# Patient Record
Sex: Female | Born: 1940 | Race: White | Hispanic: No | Smoking: Never smoker
Health system: Southern US, Community
[De-identification: ages and names within clinical notes are randomized; demographics above are authoritative.]

## PROBLEM LIST (undated history)

## (undated) DIAGNOSIS — K219 Gastro-esophageal reflux disease without esophagitis: Secondary | ICD-10-CM

## (undated) DIAGNOSIS — I10 Essential (primary) hypertension: Secondary | ICD-10-CM

## (undated) DIAGNOSIS — E079 Disorder of thyroid, unspecified: Secondary | ICD-10-CM

## (undated) HISTORY — PX: FOOT SURGERY: SHX648

## (undated) HISTORY — PX: CATARACT EXTRACTION W/ INTRAOCULAR LENS  IMPLANT, BILATERAL: SHX1307

## (undated) HISTORY — PX: ABDOMINAL HYSTERECTOMY: SHX81

## (undated) HISTORY — PX: KNEE SURGERY: SHX244

---

## 2018-07-15 ENCOUNTER — Other Ambulatory Visit: Payer: Self-pay

## 2018-07-15 ENCOUNTER — Emergency Department (HOSPITAL_COMMUNITY): Payer: Medicare Other

## 2018-07-15 ENCOUNTER — Emergency Department (HOSPITAL_COMMUNITY)
Admission: EM | Admit: 2018-07-15 | Discharge: 2018-07-16 | Disposition: A | Discharge status: Home / Self Care | Payer: Medicare Other | Attending: Emergency Medicine | Admitting: Emergency Medicine

## 2018-07-15 ENCOUNTER — Encounter (HOSPITAL_COMMUNITY): Payer: Self-pay | Admitting: Emergency Medicine

## 2018-07-15 DIAGNOSIS — R634 Abnormal weight loss: Secondary | ICD-10-CM | POA: Diagnosis not present

## 2018-07-15 DIAGNOSIS — R197 Diarrhea, unspecified: Secondary | ICD-10-CM | POA: Insufficient documentation

## 2018-07-15 DIAGNOSIS — I10 Essential (primary) hypertension: Secondary | ICD-10-CM | POA: Insufficient documentation

## 2018-07-15 DIAGNOSIS — R51 Headache: Secondary | ICD-10-CM | POA: Diagnosis not present

## 2018-07-15 DIAGNOSIS — Z79899 Other long term (current) drug therapy: Secondary | ICD-10-CM | POA: Insufficient documentation

## 2018-07-15 HISTORY — DX: Disorder of thyroid, unspecified: E07.9

## 2018-07-15 HISTORY — DX: Gastro-esophageal reflux disease without esophagitis: K21.9

## 2018-07-15 HISTORY — DX: Essential (primary) hypertension: I10

## 2018-07-15 LAB — CBC
HCT: 41.7 % (ref 36.0–46.0)
HEMOGLOBIN: 13.8 g/dL (ref 12.0–15.0)
MCH: 30.9 pg (ref 26.0–34.0)
MCHC: 33.1 g/dL (ref 30.0–36.0)
MCV: 93.3 fL (ref 80.0–100.0)
NRBC: 0 % (ref 0.0–0.2)
Platelets: 376 10*3/uL (ref 150–400)
RBC: 4.47 MIL/uL (ref 3.87–5.11)
RDW: 13.4 % (ref 11.5–15.5)
WBC: 9.1 10*3/uL (ref 4.0–10.5)

## 2018-07-15 LAB — COMPREHENSIVE METABOLIC PANEL
ALT: 21 U/L (ref 0–44)
AST: 26 U/L (ref 15–41)
Albumin: 4.3 g/dL (ref 3.5–5.0)
Alkaline Phosphatase: 48 U/L (ref 38–126)
Anion gap: 11 (ref 5–15)
BUN: 14 mg/dL (ref 8–23)
CO2: 24 mmol/L (ref 22–32)
Calcium: 9.4 mg/dL (ref 8.9–10.3)
Chloride: 100 mmol/L (ref 98–111)
Creatinine, Ser: 0.95 mg/dL (ref 0.44–1.00)
GFR calc non Af Amer: 59 mL/min — ABNORMAL LOW (ref >60)
Glucose, Bld: 115 mg/dL — ABNORMAL HIGH (ref 70–99)
Potassium: 3.8 mmol/L (ref 3.5–5.1)
Sodium: 135 mmol/L (ref 135–145)
Total Bilirubin: 1 mg/dL (ref 0.3–1.2)
Total Protein: 7 g/dL (ref 6.5–8.1)

## 2018-07-15 LAB — URINALYSIS, ROUTINE W REFLEX MICROSCOPIC
BILIRUBIN URINE: NEGATIVE
Glucose, UA: NEGATIVE mg/dL
Hgb urine dipstick: NEGATIVE
Ketones, ur: 5 mg/dL — AB
Nitrite: NEGATIVE
Protein, ur: NEGATIVE mg/dL
Specific Gravity, Urine: 1.018 (ref 1.005–1.030)
pH: 6 (ref 5.0–8.0)

## 2018-07-15 LAB — LIPASE, BLOOD: Lipase: 34 U/L (ref 11–51)

## 2018-07-15 MED ORDER — DIPHENHYDRAMINE HCL 50 MG/ML IJ SOLN
25.0000 mg | Freq: Once | INTRAMUSCULAR | Status: AC
  Administered 2018-07-15: 25 mg via INTRAVENOUS
  Filled 2018-07-15: qty 1

## 2018-07-15 MED ORDER — SODIUM CHLORIDE 0.9% FLUSH: 3.0000 mL | Freq: Once | INTRAVENOUS | Status: DC

## 2018-07-15 MED ORDER — HYDROCORTISONE NA SUCCINATE PF 100 MG IJ SOLR
200.0000 mg | Freq: Once | INTRAMUSCULAR | Status: AC
  Administered 2018-07-15: 200 mg via INTRAVENOUS
  Filled 2018-07-15: qty 4

## 2018-07-15 MED ORDER — SODIUM CHLORIDE 0.9 % IV BOLUS
1000.0000 mL | Freq: Once | INTRAVENOUS | Status: AC
  Administered 2018-07-15: 1000 mL via INTRAVENOUS

## 2018-07-15 NOTE — ED Triage Notes (Signed)
Pt. Has been treated with Flaggyl and is now taking Cipro, nothing is helping. Taken Lomotil and still having diarrhea.

## 2018-07-15 NOTE — ED Triage Notes (Signed)
Pt. Stated, Ive had diarrhea with a headache for 6 months. Ive also lost 25 lbs. In 6 months.

## 2018-07-15 NOTE — ED Provider Notes (Signed)
[facility] EMERGENCY DEPARTMENT Provider Note   CSN: [id number] Arrival date & time: 07/15/18  1904     History   Chief Complaint Chief Complaint  Patient presents with  . Diarrhea  . Headache    HPI Danara Bigley is a 78 y.o. female.  HPI Patient presents with diarrhea.  Has had for around 6 months now.  Has lost around 27 pounds.  Has been worked up by her PCP and has had reported stool studies that were negative including ova and parasites.  Patient may have had C. difficile tested but patient's family number states there were not able to see the results for that or that it was even tested out of the stool studies.  Reportedly has seen GI once and had an upper GI done.  Now has a dull headache.  Will get chills at time.  Rare cough.  States the more she eats and drinks the more diarrhea she we have.  She has been on Flagyl and then Cipro.  She is to continue diarrhea.  Has been on Lomotil.  Has had mild dull abdominal pain.  There was question that it may have started after eating some raw meat.  She is been having more urinary frequency also.  Also having some dull back pain.  Has had urinary and occasionally fecal incontinence.  Reportedly has known cysts in her back.  Diarrhea is watery.  Has had no blood.  She also will have dull headaches.  Reportedly TSH is been normal. Past Medical History:  Diagnosis Date  . GERD (gastroesophageal reflux disease)   . Hypertension   . Thyroid disease     There are no active problems to display for this patient.   History reviewed. No pertinent surgical history.   OB History   No obstetric history on file.      Home Medications    Prior to Admission medications   Medication Sig Start Date End Date Taking? Authorizing Provider  ALPRAZolam (XANAX) 0.25 MG tablet Take 0.25 mg by mouth 2 (two) times daily as needed. for anxiety 04/24/18  Yes [provider]  diphenoxylate-atropine (LOMOTIL) 2.5-0.025 MG  tablet Take 1 tablet by mouth every 6 (six) hours as needed for diarrhea or loose stools.  05/15/18  Yes [provider]  esomeprazole (NEXIUM) 40 MG capsule Take 40 mg by mouth daily. 06/01/18  Yes [provider]  levothyroxine (SYNTHROID, LEVOTHROID) 25 MCG tablet Take 25 mcg by mouth daily before breakfast.   Yes [provider]  lisinopril (PRINIVIL,ZESTRIL) 20 MG tablet Take 20 mg by mouth daily. 06/29/18  Yes [provider]    Family History No family history on file.  Social History Social History   Tobacco Use  . Smoking status: Never Smoker  . Smokeless tobacco: Never Used  Substance Use Topics  . Alcohol use: Not Currently  . Drug use: Not on file     Allergies   Sulfa antibiotics   Review of Systems Review of Systems  Constitutional: Positive for appetite change, chills, fatigue and unexpected weight change.  HENT: Negative for congestion.   Respiratory: Negative for shortness of breath.   Cardiovascular: Negative for chest pain.  Gastrointestinal: Positive for diarrhea. Negative for abdominal pain.  Genitourinary: Negative for flank pain.  Musculoskeletal: Positive for neck pain.  Skin: Negative for rash.  Neurological: Positive for weakness and headaches. Negative for tremors.  Hematological: Negative for adenopathy.  Psychiatric/Behavioral: Negative for behavioral problems.  Physical Exam Updated Vital Signs BP (!) 157/81   Pulse 86   Temp 98 F (36.7 C) (Oral)   Resp 14   Ht 5\' 6"  (1.676 m)   Wt 68 kg   SpO2 100%   BMI 24.21 kg/m   Physical Exam Constitutional:      Appearance: She is well-developed.  HENT:     Head: Atraumatic.  Neck:     Musculoskeletal: Neck supple.  Cardiovascular:     Rate and Rhythm: Normal rate and regular rhythm.  Pulmonary:     Effort: Pulmonary effort is normal.     Breath sounds: Normal breath sounds.  Abdominal:     Tenderness: There is no abdominal tenderness.    Musculoskeletal: Normal range of motion.  Neurological:     Mental Status: She is alert.  Psychiatric:        Mood and Affect: Mood normal.        Behavior: Behavior normal.      ED Treatments / Results  Labs (all labs ordered are listed, but only abnormal results are displayed) Labs Reviewed  COMPREHENSIVE METABOLIC PANEL - Abnormal; Notable for the following components:      Result Value   Glucose, Bld 115 (*)    GFR calc non Af Amer 59 (*)    All other components within normal limits  URINALYSIS, ROUTINE W REFLEX MICROSCOPIC - Abnormal; Notable for the following components:   Ketones, ur 5 (*)    Leukocytes, UA TRACE (*)    Bacteria, UA RARE (*)    All other components within normal limits  C DIFFICILE QUICK SCREEN W PCR REFLEX  GASTROINTESTINAL PANEL BY PCR, STOOL (REPLACES STOOL CULTURE)  LIPASE, BLOOD  CBC  SEROTONIN SERUM    EKG None  Radiology Dg Chest 2 View  Result Date: 07/15/2018 CLINICAL DATA:  Chills, diarrhea, hypertension EXAM: CHEST - 2 VIEW COMPARISON:  None. FINDINGS: The heart size and mediastinal contours are within normal limits. Both lungs are clear. The visualized skeletal structures are unremarkable. Electronic device over the left chest. IMPRESSION: No active cardiopulmonary disease. Electronically Signed   By: Jasmine Pang M.D.   On: 07/15/2018 22:14    Procedures Procedures (including critical care time)  Medications Ordered in ED Medications  sodium chloride flush (NS) 0.9 % injection 3 mL (has no administration in time range)  sodium chloride 0.9 % bolus 1,000 mL (1,000 mLs Intravenous New Bag/Given 07/15/18 2218)  hydrocortisone sodium succinate (SOLU-CORTEF) 100 MG injection 200 mg (has no administration in time range)  diphenhydrAMINE (BENADRYL) injection 25 mg (has no administration in time range)     Initial Impression / Assessment and Plan / ED Course  I have reviewed the triage vital signs and the nursing  notes.  Pertinent labs & imaging results that were available during my care of the patient were reviewed by me and considered in my medical decision making (see chart for details).     Patient with diarrhea.  He has had over the last 6 months.  Has had 27 pounds weight loss.  Labs reassuring.  Has had previous work-up.  However will get CAT scan.  Has had questionable dye allergy previously.  States she has had a CAT scan with contrast it sounds like she is just had Benadryl in the past and not had to get steroids although she did have one episode of shortness of breath prior.  Will give Benadryl and Solu-Cortef as an hour prep.  Discussed with radiology.  Care turned over to Dr. Preston FleetingGlick.  Final Clinical Impressions(s) / ED Diagnoses   Final diagnoses:  Diarrhea, unspecified type    ED Discharge Orders    None       Benjiman CorePickering, Harriette Tovey, MD 07/15/18 2305

## 2018-07-16 ENCOUNTER — Emergency Department (HOSPITAL_COMMUNITY): Payer: Medicare Other

## 2018-07-16 DIAGNOSIS — R197 Diarrhea, unspecified: Secondary | ICD-10-CM | POA: Diagnosis not present

## 2018-07-16 MED ORDER — IOHEXOL 300 MG/ML  SOLN
100.0000 mL | Freq: Once | INTRAMUSCULAR | Status: AC | PRN
  Administered 2018-07-16: 100 mL via INTRAVENOUS

## 2018-07-16 MED ORDER — CHOLESTYRAMINE 4 G PO PACK: 4.0000 g | PACK | Freq: Four times a day (QID) | ORAL | 0 refills | Status: DC

## 2018-07-16 NOTE — Discharge Instructions (Addendum)
You will need additional outpatient testing to find the cause of your diarrhea. Please make an appointment with the gastroenterologist.  You will be able to see the results of the blood serotonin level in My Chart.

## 2018-07-16 NOTE — ED Provider Notes (Signed)
Care assumed from Dr. Rubin PayorPickering, patient getting CT of abdomen and pelvis as part of evaluation for persistent diarrhea and weight loss.  CT shows mild diverticulosis, no explanation for weight loss.  Specifically, no evidence of tumor.  While this makes carcinoid less likely, there are carcinoid tumors can be present outside of the GI tract.  Serotonin level is pending.  Patient has been unable to give a stool sample in the ED.  She is referred to gastroenterology for further outpatient work-up.  We will give a therapeutic trial of cholestyramine.  Results for orders placed or performed during the hospital encounter of 07/15/18  Lipase, blood  Result Value Ref Range   Lipase 34 11 - 51 U/L  Comprehensive metabolic panel  Result Value Ref Range   Sodium 135 135 - 145 mmol/L   Potassium 3.8 3.5 - 5.1 mmol/L   Chloride 100 98 - 111 mmol/L   CO2 24 22 - 32 mmol/L   Glucose, Bld 115 (H) 70 - 99 mg/dL   BUN 14 8 - 23 mg/dL   Creatinine, Ser 1.610.95 0.44 - 1.00 mg/dL   Calcium 9.4 8.9 - 09.610.3 mg/dL   Total Protein 7.0 6.5 - 8.1 g/dL   Albumin 4.3 3.5 - 5.0 g/dL   AST 26 15 - 41 U/L   ALT 21 0 - 44 U/L   Alkaline Phosphatase 48 38 - 126 U/L   Total Bilirubin 1.0 0.3 - 1.2 mg/dL   GFR calc non Af Amer 59 (L) >60 mL/min   GFR calc Af Amer >60 >60 mL/min   Anion gap 11 5 - 15  CBC  Result Value Ref Range   WBC 9.1 4.0 - 10.5 K/uL   RBC 4.47 3.87 - 5.11 MIL/uL   Hemoglobin 13.8 12.0 - 15.0 g/dL   HCT 04.541.7 40.936.0 - 81.146.0 %   MCV 93.3 80.0 - 100.0 fL   MCH 30.9 26.0 - 34.0 pg   MCHC 33.1 30.0 - 36.0 g/dL   RDW 91.413.4 78.211.5 - 95.615.5 %   Platelets 376 150 - 400 K/uL   nRBC 0.0 0.0 - 0.2 %  Urinalysis, Routine w reflex microscopic  Result Value Ref Range   Color, Urine YELLOW YELLOW   APPearance CLEAR CLEAR   Specific Gravity, Urine 1.018 1.005 - 1.030   pH 6.0 5.0 - 8.0   Glucose, UA NEGATIVE NEGATIVE mg/dL   Hgb urine dipstick NEGATIVE NEGATIVE   Bilirubin Urine NEGATIVE NEGATIVE   Ketones, ur 5  (A) NEGATIVE mg/dL   Protein, ur NEGATIVE NEGATIVE mg/dL   Nitrite NEGATIVE NEGATIVE   Leukocytes, UA TRACE (A) NEGATIVE   RBC / HPF 0-5 0 - 5 RBC/hpf   WBC, UA 0-5 0 - 5 WBC/hpf   Bacteria, UA RARE (A) NONE SEEN   Squamous Epithelial / LPF 0-5 0 - 5   Mucus PRESENT    Dg Chest 2 View  Result Date: 07/15/2018 CLINICAL DATA:  Chills, diarrhea, hypertension EXAM: CHEST - 2 VIEW COMPARISON:  None. FINDINGS: The heart size and mediastinal contours are within normal limits. Both lungs are clear. The visualized skeletal structures are unremarkable. Electronic device over the left chest. IMPRESSION: No active cardiopulmonary disease. Electronically Signed   By: Jasmine PangKim  Fujinaga M.D.   On: 07/15/2018 22:14   Ct Abdomen Pelvis W Contrast  Result Date: 07/16/2018 CLINICAL DATA:  Diarrhea, weight loss EXAM: CT ABDOMEN AND PELVIS WITH CONTRAST TECHNIQUE: Multidetector CT imaging of the abdomen and pelvis was performed using the  standard protocol following bolus administration of intravenous contrast. CONTRAST:  OMNIPAQUE IOHEXOL 300 MG/ML  SOLN COMPARISON:  None. FINDINGS: Lower chest: No acute abnormality. Hepatobiliary: No focal liver abnormality is seen. No gallstones, gallbladder wall thickening, or biliary dilatation. Pancreas: Unremarkable. No pancreatic ductal dilatation or surrounding inflammatory changes. Spleen: Normal in size without focal abnormality. Adrenals/Urinary Tract: Adrenal glands are unremarkable. Kidneys are normal, without renal calculi, focal lesion, or hydronephrosis. Bladder is unremarkable. Stomach/Bowel: Stomach is within normal limits. Appendix not well seen but no right lower quadrant inflammatory process. No evidence of bowel wall thickening, distention, or inflammatory changes. Sigmoid colon diverticular disease without acute inflammatory process Vascular/Lymphatic: Moderate aortic atherosclerosis. No aneurysm. No significantly enlarged lymph nodes. Reproductive: Status post  hysterectomy. No adnexal masses. Other: Negative for free air or free fluid Musculoskeletal: No acute or significant osseous findings. IMPRESSION: 1. No CT evidence for acute intra-abdominal or pelvic abnormality. 2. Sigmoid colon diverticular disease without acute inflammatory process Electronically Signed   By: Jasmine Pang M.D.   On: 07/16/2018 01:00   Images viewed by me.    Dione Booze, MD 07/16/18 [phone]

## 2018-07-19 LAB — SEROTONIN SERUM: Serotonin, Serum: 152 ng/mL (ref 0–420)

## 2018-08-21 ENCOUNTER — Encounter: Payer: Self-pay | Admitting: Neurology

## 2018-09-02 NOTE — Progress Notes (Signed)
Melanie Graham was seen today in the movement disorders clinic for neurologic consultation at the request of Jonathon Bellows, DO.  The consultation is for the evaluation of tremor.  This patient is accompanied in the office by her friend who supplements the history.  Pt is referred for tremor but has multiple c/o today  Tremor: Yes.   , states that she had a "bad life" starting in July with diarrhea and fatigue and had multiple ER visits following this for "hyponatremia."  "I thought the tremor was coming from hyponatremia but now they think that its related to the hyperthyroidism."  States that she just saw endocrinology yesterday at [facility].    When is it noted the most?  Holding things, writing - states that it is much better now that she is no longer hyponatremic (brings in labs from December and Na was 125).  Unsure what made it better but does state that she went off a lot of medications because "I was having a bad life."  Fam hx of tremor?  No.  Affected by caffeine:  Doesn't drink any  Affected by alcohol:  Doesn't drink any  Affected by stress:  No.  Affected by fatigue:  No.  Spills soup if on spoon:  No.  Spills glass of liquid if full:  No.  Affects ADL's (tying shoes, brushing teeth, etc):  No.  Tremor inducing meds:  No. (only on OTC vitamins - no tremor inducing vitamins)  She also c/o burning paresthesias of armpit and deltoid bilaterally.  "My armpit has started to smell as well."   "I have always had a rusty neck."  When asked what that means, she c/o neck pain.  No neuroimaging previously of the neck.  Has had EMG of the legs in the past.  Unknown results.  None of the arms but states that she was previously told EMG of the legs would give her information about the burning paresthesias in the arms.     Neuroimaging of the brain has  previously been performed.  It is not available for my review today.  She had a carotid u/s on 05/27/18 and there was less than 50%  stenosis bilaterally.  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    She did not bring the films, only the reports.   ALLERGIES:   Allergies  Allergen Reactions   Sulfa Antibiotics Rash    CURRENT MEDICATIONS:  Outpatient Encounter Medications as of 09/04/2018  Medication Sig   [DISCONTINUED] ALPRAZolam (XANAX) 0.25 MG tablet Take 0.25 mg by mouth 2 (two) times daily as needed. for anxiety   [DISCONTINUED] cholestyramine (QUESTRAN) 4 g packet Take 1 packet (4 g total) by mouth 4 (four) times daily.   [DISCONTINUED] diphenoxylate-atropine (LOMOTIL) 2.5-0.025 MG tablet Take 1 tablet by mouth every 6 (six) hours as needed for diarrhea or loose stools.    [DISCONTINUED] esomeprazole (NEXIUM) 40 MG capsule Take 40 mg by mouth daily.   [DISCONTINUED] levothyroxine (SYNTHROID, LEVOTHROID) 25 MCG tablet Take 25 mcg by mouth daily before breakfast.   [DISCONTINUED] lisinopril (PRINIVIL,ZESTRIL) 20 MG tablet Take 20 mg by mouth daily.   No facility-administered encounter medications on file as of 09/04/2018.     PAST MEDICAL HISTORY:   Past Medical History:  Diagnosis Date   GERD (gastroesophageal reflux disease)    Hypertension    Thyroid disease     PAST SURGICAL HISTORY:   Past Surgical History:  Procedure Laterality  Date   ABDOMINAL HYSTERECTOMY     CATARACT EXTRACTION W/ INTRAOCULAR LENS  IMPLANT, BILATERAL Bilateral    FOOT SURGERY Right    KNEE SURGERY Right    meninscal tear    SOCIAL HISTORY:   Social History   Socioeconomic History   Marital status: Unknown    Spouse name: Not on file   Number of children: Not on file   Years of education: Not on file   Highest education level: Not on file  Occupational History   Occupation: retired    Comment: Location manager  Social Network engineer strain: Not on file   Food insecurity:    Worry: Not on file    Inability: Not on file   Transportation needs:     Medical: Not on file    Non-medical: Not on file  Tobacco Use   Smoking status: Never Smoker   Smokeless tobacco: Never Used  Substance and Sexual Activity   Alcohol use: Not Currently   Drug use: Not on file   Sexual activity: Not on file  Lifestyle   Physical activity:    Days per week: Not on file    Minutes per session: Not on file   Stress: Not on file  Relationships   Social connections:    Talks on phone: Not on file    Gets together: Not on file    Attends religious service: Not on file    Active member of club or organization: Not on file    Attends meetings of clubs or organizations: Not on file    Relationship status: Not on file   Intimate partner violence:    Fear of current or ex partner: Not on file    Emotionally abused: Not on file    Physically abused: Not on file    Forced sexual activity: Not on file  Other Topics Concern   Not on file  Social History Narrative   Not on file    FAMILY HISTORY:   Family Status  Relation Name Status   Mother  Deceased   Father  Deceased   Sister  Alive   Brother  Alive   Child  Deceased    ROS:  Review of Systems  Eyes: Negative.   Respiratory: Positive for shortness of breath (chronic).   Cardiovascular: Negative.   Gastrointestinal: Positive for diarrhea (recent neg colonocopy) and heartburn.  Genitourinary: Positive for frequency and urgency.  Musculoskeletal: Positive for back pain and neck pain.  Neurological: Positive for tingling.  Endo/Heme/Allergies: Negative.     PHYSICAL EXAMINATION:    VITALS:   Vitals:   09/04/18 1310  BP: 118/80  Pulse: 77  Temp: 98.8 F (37.1 C)  Weight: 144 lb (65.3 kg)  Height: 5\' 6"  (1.676 m)    GEN:  The patient appears stated age and is in NAD. HEENT:  Normocephalic, atraumatic.  The mucous membranes are moist. The superficial temporal arteries are without ropiness or tenderness. CV:  RRR Lungs:  CTAB Neck/HEME:  There are no carotid bruits  bilaterally.  Neurological examination:  Orientation: The patient is alert and oriented x3. Fund of knowledge is appropriate.  Recent and remote memory are intact.  Attention and concentration are normal.    Able to name objects and repeat phrases. Cranial nerves: There is good facial symmetry. Pupils are equal round and reactive to light bilaterally. Fundoscopic exam reveals clear margins bilaterally. Extraocular muscles are intact. The visual fields are full to confrontational testing.  The speech is fluent and clear. Soft palate rises symmetrically and there is no tongue deviation. Hearing is intact to conversational tone. Sensation: Sensation is intact to light and pinprick throughout (facial, trunk, extremities).  There were no asymmetries.  There was no sensory dermatomal level identified.  Vibration is intact at the bilateral big toe. There is no extinction with double simultaneous stimulation.  Motor: Strength is 5/5 in the bilateral upper and lower extremities.   Shoulder shrug is equal and symmetric.  There is no pronator drift. Deep tendon reflexes: Deep tendon reflexes are 2+/4 at the bilateral biceps, triceps, brachioradialis, patella and achilles. Plantar responses are downgoing bilaterally.  Movement examination: Tone: There is normal tone in the upper and lower extremities. Abnormal movements: There is no rest tremor.  There is very little postural tremor.  There is no intention tremor.  She has no trouble with Archimedes spirals.  She has no trouble with pouring water from one glass to another. Coordination:  There is no decremation with RAM's, with any form of RAMS, including alternating supination and pronation of the forearm, hand opening and closing, finger taps, heel taps and toe taps. Gait and Station: The patient has no difficulty arising out of a deep-seated chair without the use of the hands. The patient's stride length is normal.  She is able to ambulate in a tandem  fashion.  Labs: Lab work has been reviewed.  This was done on December 12, 2017.  White blood cells were 6.4, hemoglobin 13.6, hematocrit 41.0 and platelets 270.  Sodium was 139, potassium 4.7, chloride 101, CO2 30, BUN 19 and creatinine 0.9.  AST 28, ALT 30, TSH 1.62, B12 500.  She had lab work on March 06, 2018.  White blood cells were 7.0, hemoglobin 13.3, hematocrit 40.8 and platelets 283.  Sodium was 135, potassium 4.2, chloride 101, CO2 26, BUN 21, creatinine 1.0, TSH 1.38.  She had lab work in December, 2019.  At that point her sodium was low at 125, potassium 4.1, chloride 90, CO2 23, BUN 16 and creatinine 0.92.  Hemoglobin A1c was 6.1.  B12 was 3166.  She had lab work on August 14, 2018.  Sodium was 137, potassium 4.1, chloride 97, CO2 23, BUN 11, creatinine 0.94, glucose 113.  ASSESSMENT/PLAN:  1.  Tremor  -I really did not see any significant tremor today.  She may have a mild enhanced physiologic tremor, but otherwise did not see any significant degree of tremor.  She reports that it has gotten much better.  I would not recommend any treatment for the degree of tremor that I saw today.  Reassurance was provided.  2.  Paresthesias  -Her biggest complaint today was burning paresthesias of the arm, left greater than right.  I will do an MRI of the cervical spine just to make sure we are not missing anything like syringomyelia given that she describes a cape-like distribution of symptoms.  -She will have an EMG.  If this is negative, I told her that I really do not know the cause of her symptoms.    -Patient has a very supratherapeutic B12, and I told her she probably could stop her supplement.  She states that she has already done this.  3.  We will call her with the results of the above.  Cc:  Jonathon Bellows, DO

## 2018-09-04 ENCOUNTER — Ambulatory Visit (INDEPENDENT_AMBULATORY_CARE_PROVIDER_SITE_OTHER): Payer: Medicare Other | Admitting: Neurology

## 2018-09-04 ENCOUNTER — Other Ambulatory Visit: Payer: Self-pay

## 2018-09-04 ENCOUNTER — Encounter: Payer: Self-pay | Admitting: Neurology

## 2018-09-04 VITALS — BP 118/80 | HR 77 | Temp 98.8°F | Ht 66.0 in | Wt 144.0 lb

## 2018-09-04 DIAGNOSIS — R251 Tremor, unspecified: Secondary | ICD-10-CM | POA: Diagnosis not present

## 2018-09-04 DIAGNOSIS — M542 Cervicalgia: Secondary | ICD-10-CM | POA: Diagnosis not present

## 2018-09-04 DIAGNOSIS — R202 Paresthesia of skin: Secondary | ICD-10-CM | POA: Diagnosis not present

## 2018-09-04 NOTE — Patient Instructions (Signed)
You will receive a phone call to schedule an appointment at [facility] for an open MRI  You will be scheduled for an EMG

## 2018-09-14 ENCOUNTER — Telehealth: Payer: Self-pay | Admitting: Neurology

## 2018-09-14 NOTE — Telephone Encounter (Signed)
[facility] is needing valid orders for the MRI with or without contrast for cervical Spine. Please fax this over to [phone]. Thanks!

## 2018-09-14 NOTE — Telephone Encounter (Signed)
Dr. Arbutus Leas ordered MR C-spine without contrast. Order sent to [phone] with confirmation received.

## 2018-09-14 NOTE — Telephone Encounter (Signed)
Dr. Tat patient 

## 2018-09-29 ENCOUNTER — Encounter: Payer: Medicare Other | Admitting: Neurology

## 2018-11-05 ENCOUNTER — Telehealth: Payer: Self-pay | Admitting: Neurology

## 2018-11-05 NOTE — Telephone Encounter (Signed)
Patient was wanting to know about MRI that was supposed to be ordered for her neck and head. She is wanting to get this done in [place] and is needing an order to give them. In [facility] in [place]. Please call her back at [phone]. Thanks!

## 2018-11-05 NOTE — Telephone Encounter (Signed)
Patient notified that I will send the order to [facility] in [place].

## 2018-11-12 ENCOUNTER — Telehealth: Payer: Self-pay | Admitting: *Deleted

## 2018-11-12 NOTE — Telephone Encounter (Signed)
I called patient to make sure that [facility] got her referral.  She said they did and she will be having MRI soon.

## 2019-03-14 IMAGING — DX DG CHEST 2V
2 series · 2 of 2 positions shown · non-contrast
Comparison: None.

CLINICAL DATA: Chills, diarrhea, hypertension

EXAM:
CHEST - 2 VIEW

[chest pa]
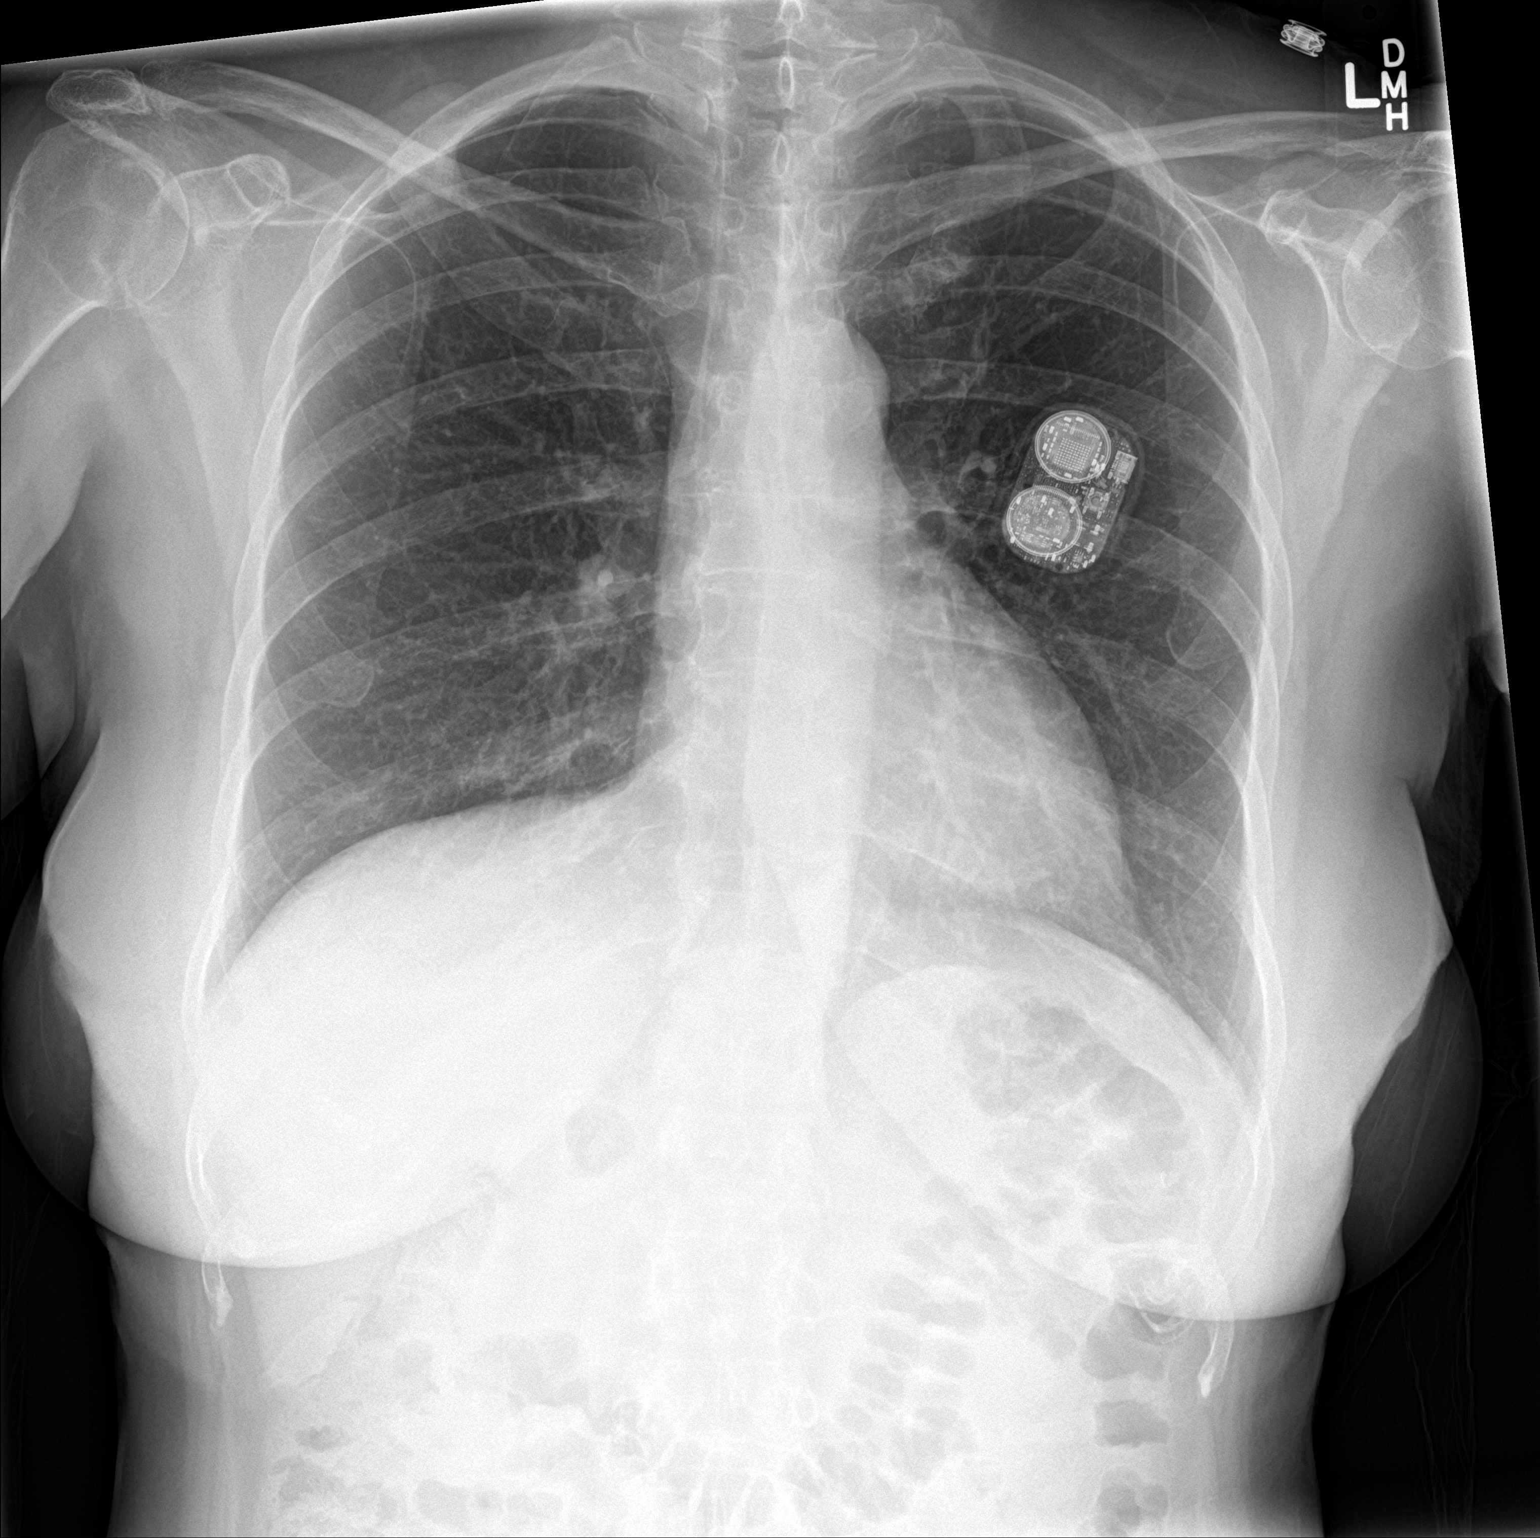

[chest lat]
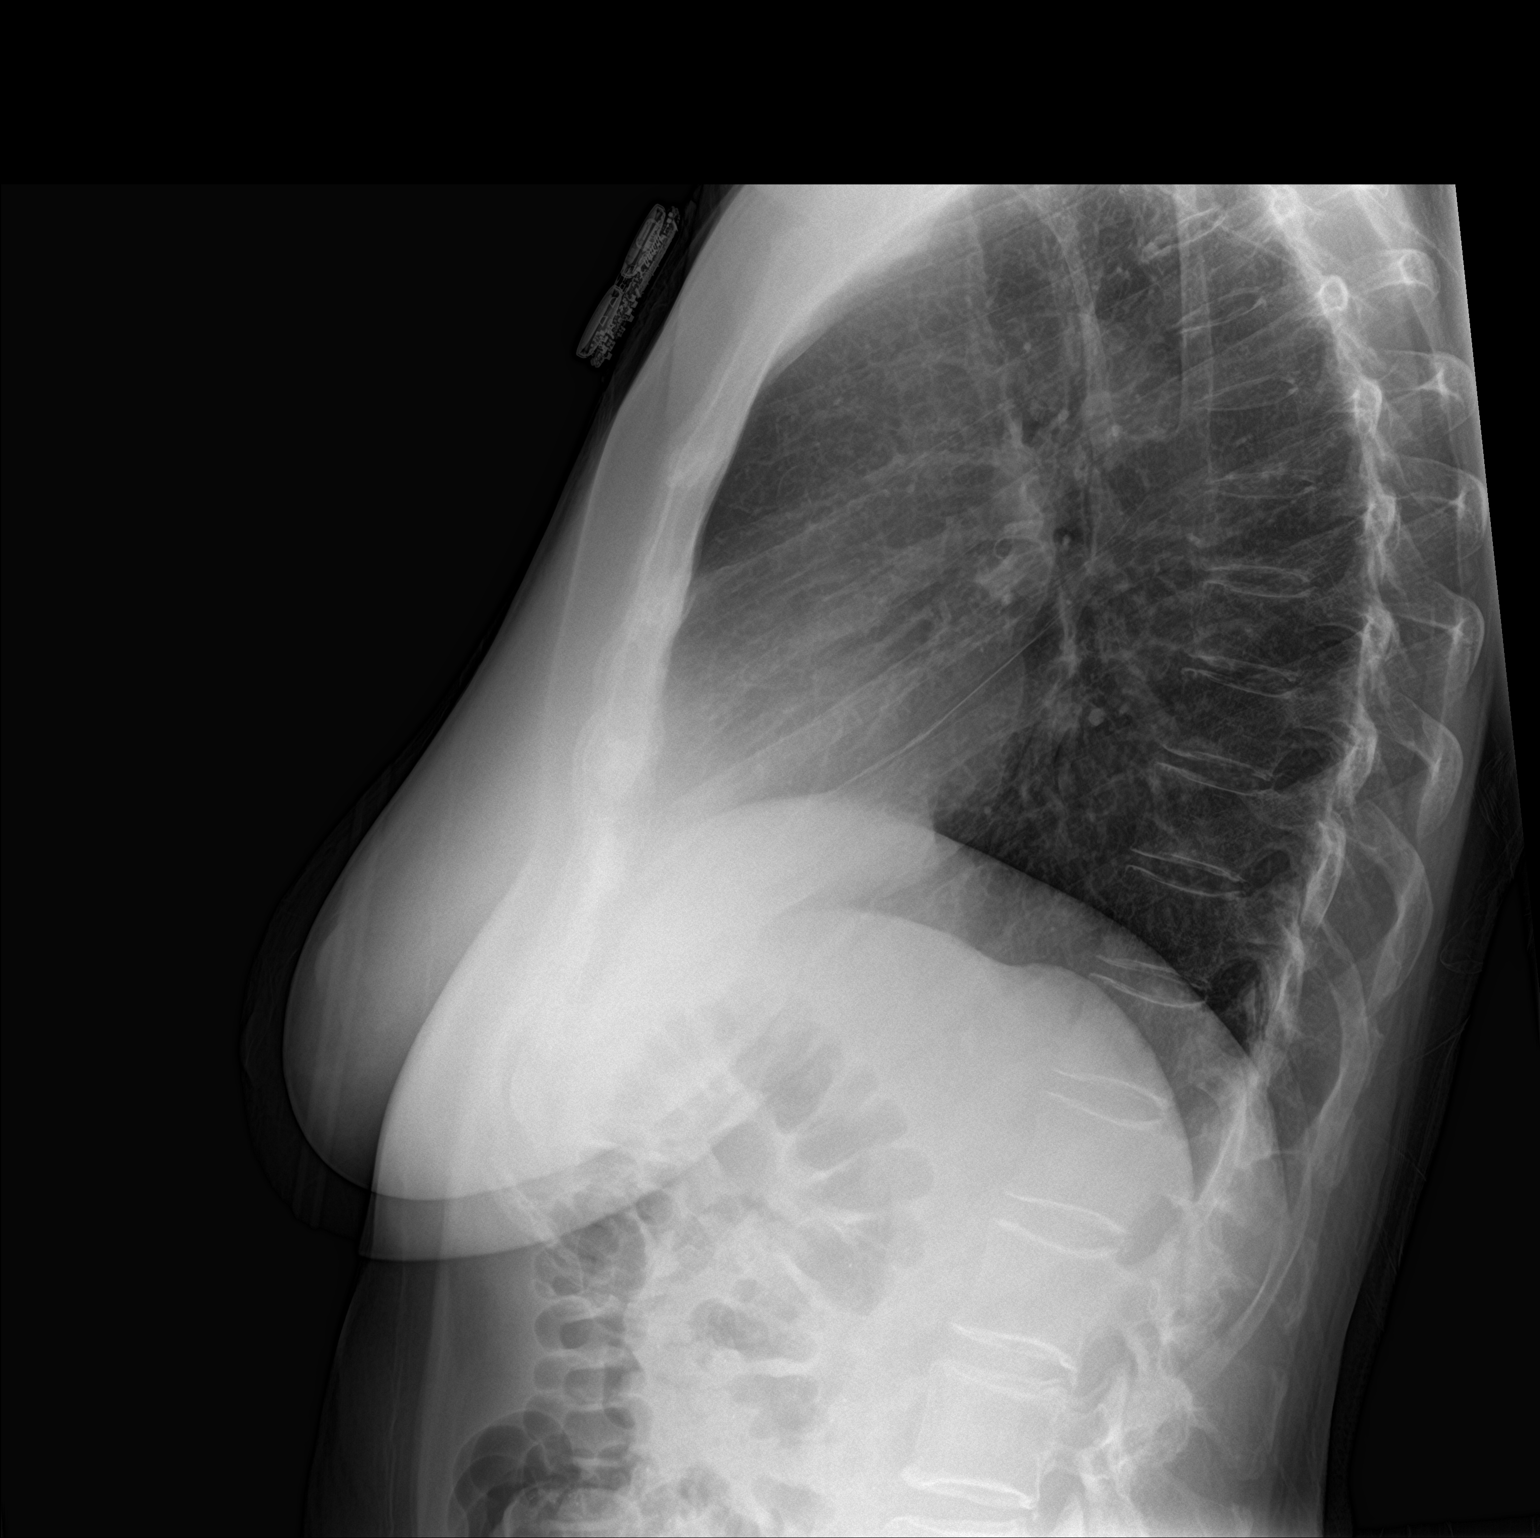

[2 of 2 positions shown; findings below may reference images not displayed]

FINDINGS: The heart size and mediastinal contours are within normal limits.
Both lungs are clear. The visualized skeletal structures are
unremarkable. Electronic device over the left chest.
IMPRESSION: No active cardiopulmonary disease.

## 2019-03-15 IMAGING — CT CT ABD-PELV W/ CM
2 of 5 series · 16 of 46 positions shown, 18 images · IV contrast (omnipaque)
Comparison: None.

CLINICAL DATA: Diarrhea, weight loss

EXAM:
CT ABDOMEN AND PELVIS WITH CONTRAST
TECHNIQUE: Multidetector CT imaging of the abdomen and pelvis was performed
using the standard protocol following bolus administration of
intravenous contrast.
CONTRAST:  100mL OMNIPAQUE IOHEXOL 300 MG/ML  SOLN

[Series 3: a/p w/ 5mm · axial · 0.78mm/px · z∈[-975,-550]mm · 13 of 95 slices shown, 15 images]
[im 5/95  soft-tissue]
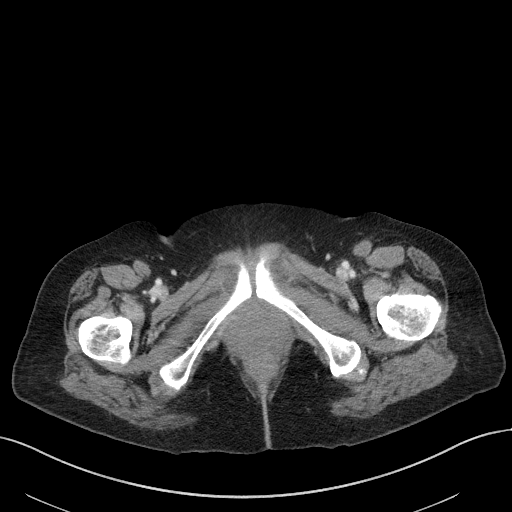
[im 5/95  bone]
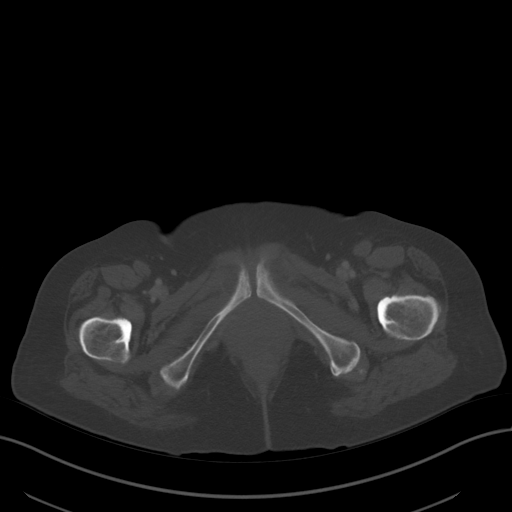
[im 15/95  soft-tissue]
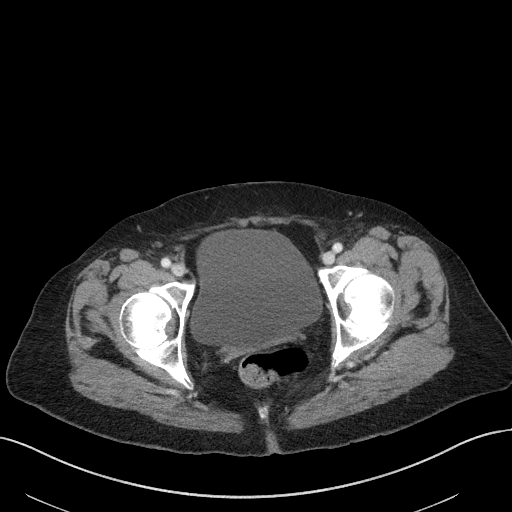
[im 20/95  soft-tissue]
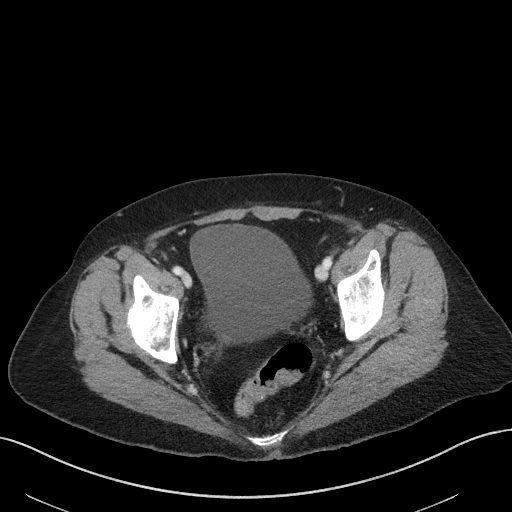
[im 25/95  soft-tissue]
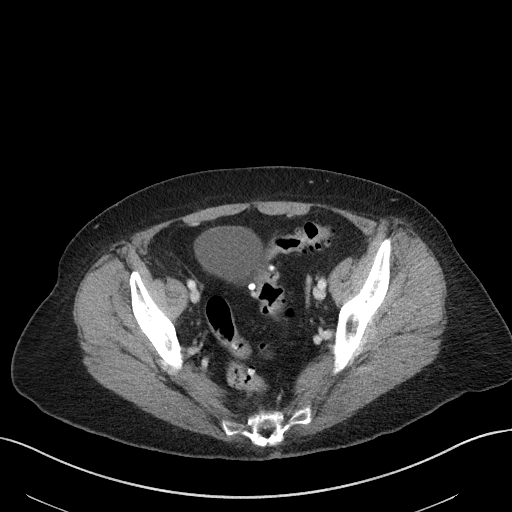
[im 35/95  soft-tissue]
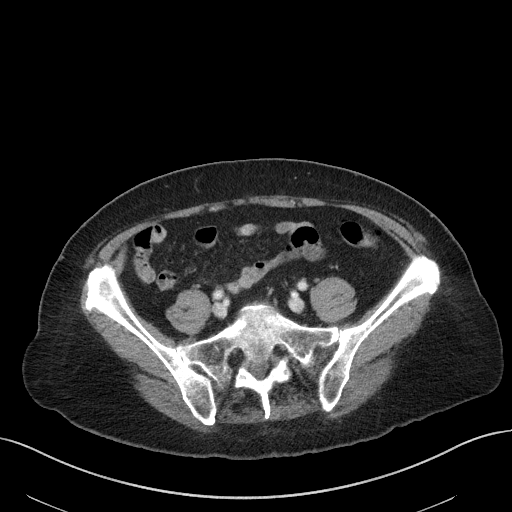
[im 40/95  soft-tissue]
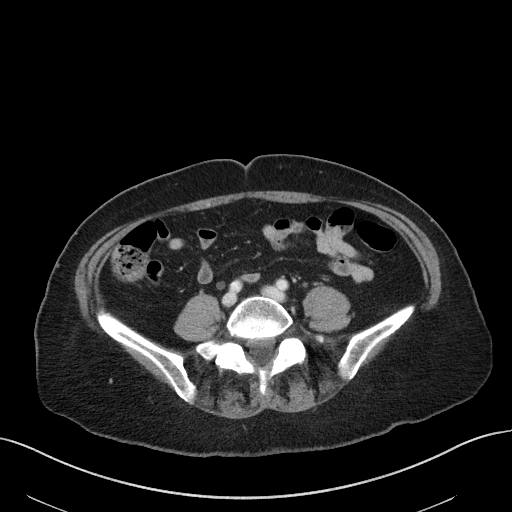
[im 50/95  soft-tissue]
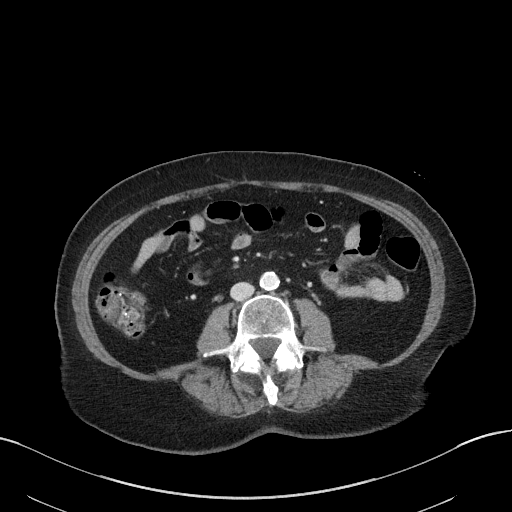
[im 55/95  soft-tissue]
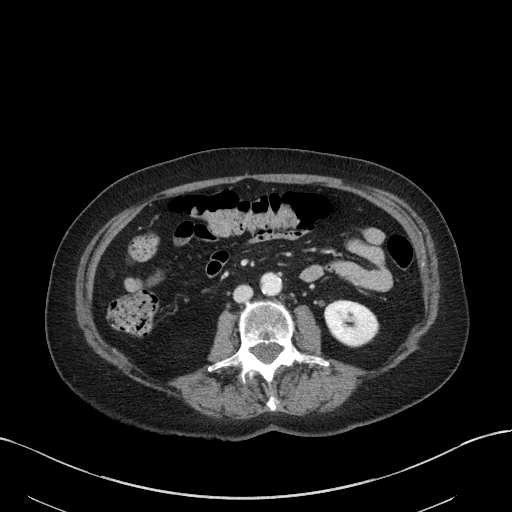
[im 60/95  soft-tissue]
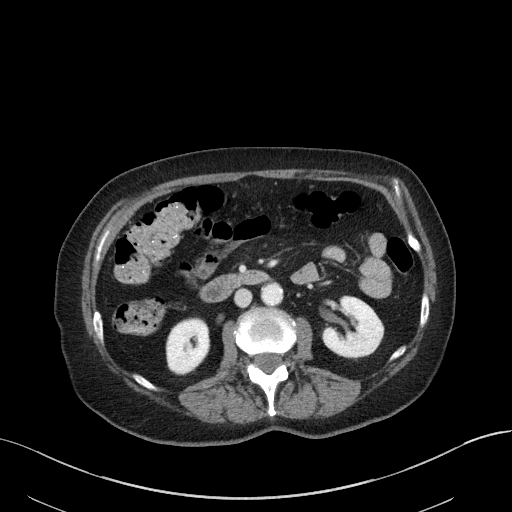
[im 60/95  bone]
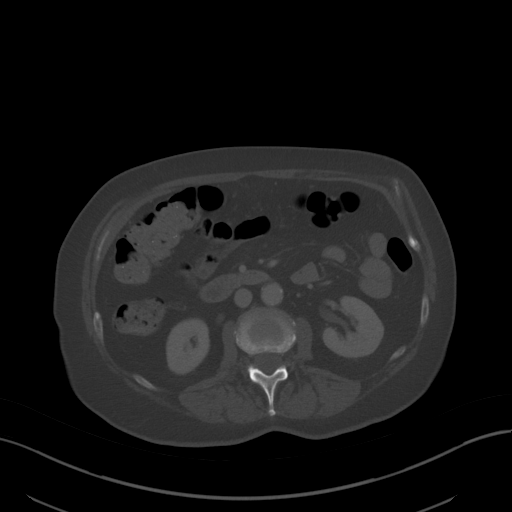
[im 70/95  soft-tissue]
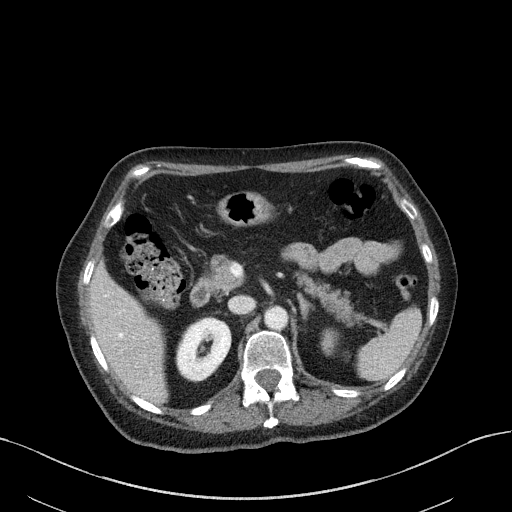
[im 75/95  soft-tissue]
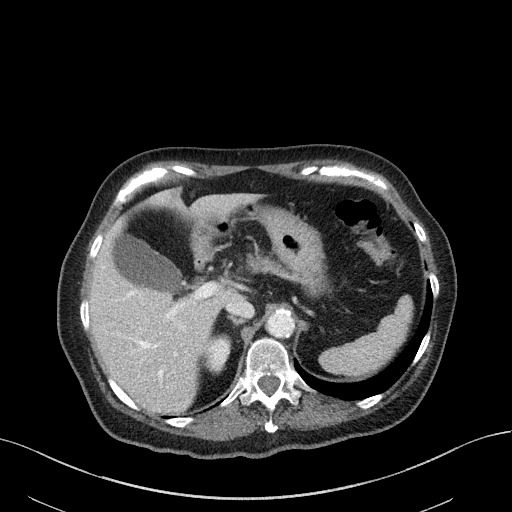
[im 80/95  soft-tissue]
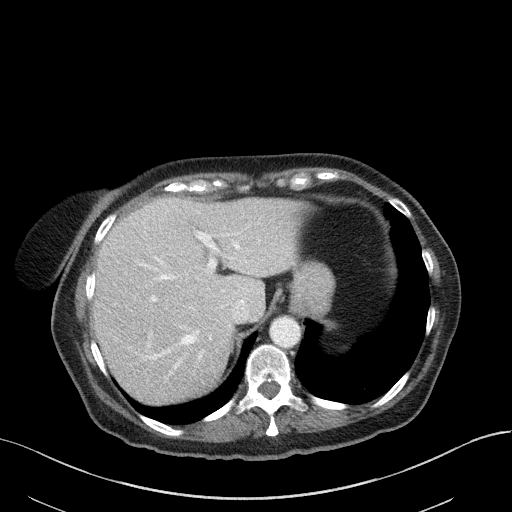
[im 90/95  soft-tissue]
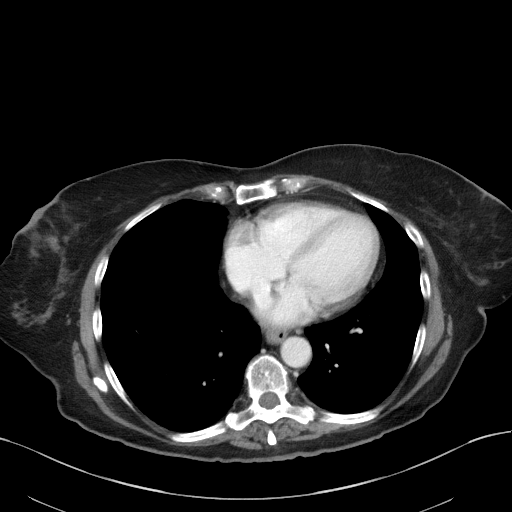

[Series 6: a/p w/ cor · coronal · 0.70mm/px · 3 of 124 slices shown]
[im 42/124  soft-tissue]
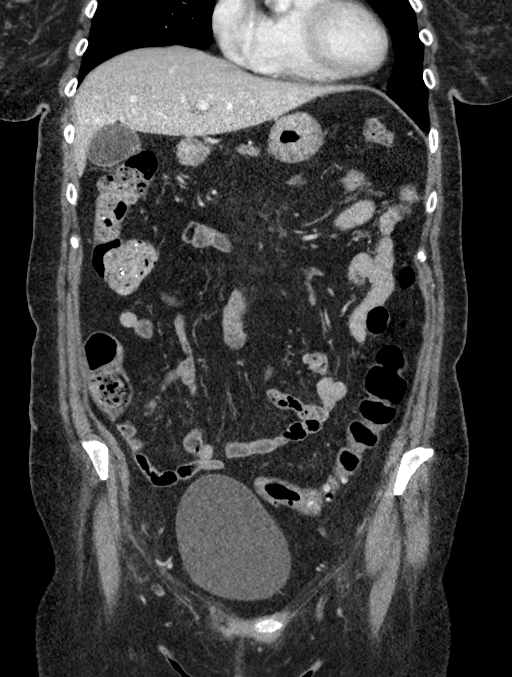
[im 55/124  soft-tissue]
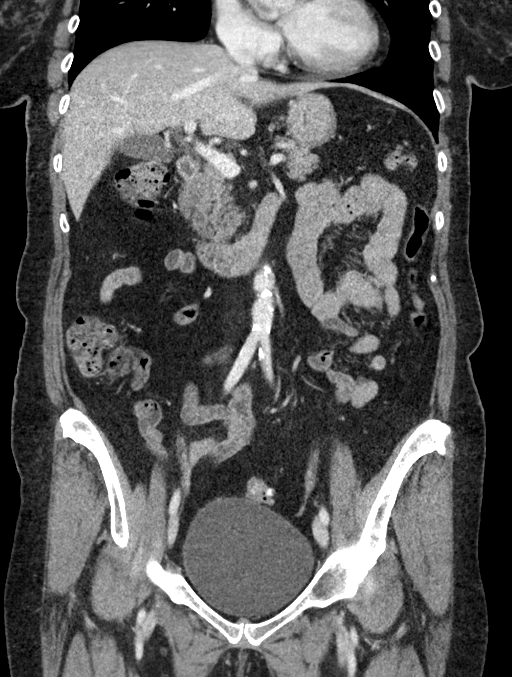
[im 69/124  soft-tissue]
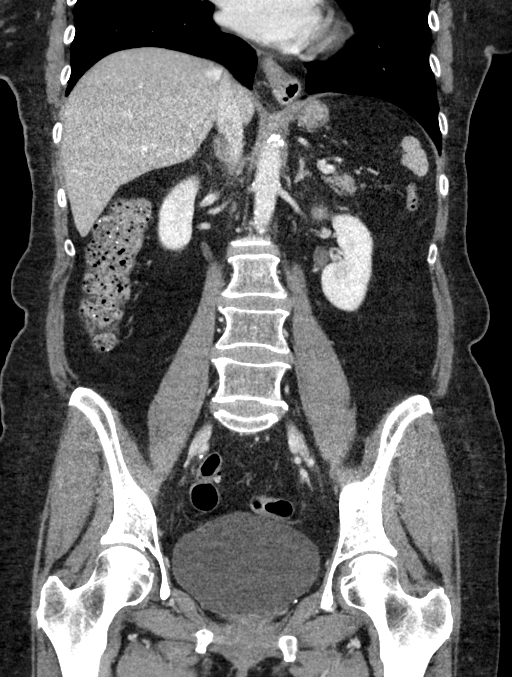

[16 of 46 positions shown; findings below may reference images not displayed]

FINDINGS: Lower chest: No acute abnormality.

Hepatobiliary: No focal liver abnormality is seen. No gallstones,
gallbladder wall thickening, or biliary dilatation.

Pancreas: Unremarkable. No pancreatic ductal dilatation or
surrounding inflammatory changes.

Spleen: Normal in size without focal abnormality.

Adrenals/Urinary Tract: Adrenal glands are unremarkable. Kidneys are
normal, without renal calculi, focal lesion, or hydronephrosis.
Bladder is unremarkable.

Stomach/Bowel: Stomach is within normal limits. Appendix not well
seen but no right lower quadrant inflammatory process. No evidence
of bowel wall thickening, distention, or inflammatory changes.
Sigmoid colon diverticular disease without acute inflammatory
process

Vascular/Lymphatic: Moderate aortic atherosclerosis. No aneurysm. No
significantly enlarged lymph nodes.

Reproductive: Status post hysterectomy. No adnexal masses.

Other: Negative for free air or free fluid

Musculoskeletal: No acute or significant osseous findings.
IMPRESSION: 1. No CT evidence for acute intra-abdominal or pelvic abnormality.
2. Sigmoid colon diverticular disease without acute inflammatory
process
